# Patient Record
Sex: Male | Born: 1955 | Race: White | Hispanic: No | Marital: Married | Smoking: Current every day smoker
Health system: Southern US, Community
[De-identification: ages and names within clinical notes are randomized; demographics above are authoritative.]

## PROBLEM LIST (undated history)

## (undated) DIAGNOSIS — I499 Cardiac arrhythmia, unspecified: Secondary | ICD-10-CM

## (undated) DIAGNOSIS — I2510 Atherosclerotic heart disease of native coronary artery without angina pectoris: Secondary | ICD-10-CM

## (undated) DIAGNOSIS — I10 Essential (primary) hypertension: Secondary | ICD-10-CM

## (undated) DIAGNOSIS — I219 Acute myocardial infarction, unspecified: Secondary | ICD-10-CM

## (undated) DIAGNOSIS — J449 Chronic obstructive pulmonary disease, unspecified: Secondary | ICD-10-CM

## (undated) HISTORY — PX: OTHER SURGICAL HISTORY: SHX169

---

## 2003-03-10 ENCOUNTER — Encounter
Admission: RE | Admit: 2003-03-10 | Discharge: 2003-06-08 | Payer: Self-pay | Admitting: Physical Medicine & Rehabilitation

## 2003-04-29 ENCOUNTER — Encounter: Payer: Self-pay | Admitting: Physical Medicine & Rehabilitation

## 2003-04-29 ENCOUNTER — Ambulatory Visit (HOSPITAL_COMMUNITY)
Admission: RE | Admit: 2003-04-29 | Discharge: 2003-04-29 | Payer: Self-pay | Admitting: Physical Medicine & Rehabilitation

## 2012-02-22 DIAGNOSIS — I240 Acute coronary thrombosis not resulting in myocardial infarction: Secondary | ICD-10-CM | POA: Insufficient documentation

## 2012-02-26 HISTORY — PX: CORONARY ANGIOPLASTY: SHX604

## 2012-02-26 HISTORY — PX: CARDIAC CATHETERIZATION: SHX172

## 2013-03-24 DIAGNOSIS — K769 Liver disease, unspecified: Secondary | ICD-10-CM | POA: Insufficient documentation

## 2013-03-24 DIAGNOSIS — I059 Rheumatic mitral valve disease, unspecified: Secondary | ICD-10-CM | POA: Insufficient documentation

## 2013-03-24 DIAGNOSIS — M47816 Spondylosis without myelopathy or radiculopathy, lumbar region: Secondary | ICD-10-CM | POA: Insufficient documentation

## 2013-03-24 DIAGNOSIS — M542 Cervicalgia: Secondary | ICD-10-CM | POA: Insufficient documentation

## 2013-07-08 DIAGNOSIS — K635 Polyp of colon: Secondary | ICD-10-CM | POA: Insufficient documentation

## 2013-08-28 DIAGNOSIS — M545 Low back pain, unspecified: Secondary | ICD-10-CM | POA: Insufficient documentation

## 2013-10-01 DIAGNOSIS — I456 Pre-excitation syndrome: Secondary | ICD-10-CM | POA: Insufficient documentation

## 2013-10-12 ENCOUNTER — Other Ambulatory Visit (HOSPITAL_COMMUNITY): Payer: Self-pay | Admitting: Neurosurgery

## 2013-10-12 DIAGNOSIS — S335XXA Sprain of ligaments of lumbar spine, initial encounter: Secondary | ICD-10-CM

## 2013-10-13 ENCOUNTER — Other Ambulatory Visit: Payer: Self-pay | Admitting: Radiology

## 2013-10-14 ENCOUNTER — Encounter (HOSPITAL_COMMUNITY): Payer: Self-pay | Admitting: Pharmacy Technician

## 2013-10-16 ENCOUNTER — Ambulatory Visit (HOSPITAL_COMMUNITY)
Admission: RE | Admit: 2013-10-16 | Discharge: 2013-10-16 | Disposition: A | Discharge status: Home / Self Care | Payer: Worker's Compensation | Source: Ambulatory Visit | Attending: Neurosurgery | Admitting: Neurosurgery

## 2013-10-16 DIAGNOSIS — S335XXA Sprain of ligaments of lumbar spine, initial encounter: Secondary | ICD-10-CM

## 2013-10-16 DIAGNOSIS — M545 Low back pain, unspecified: Secondary | ICD-10-CM | POA: Insufficient documentation

## 2013-10-16 MED ORDER — OXYCODONE-ACETAMINOPHEN 5-325 MG PO TABS
ORAL_TABLET | ORAL | Status: AC
  Filled 2013-10-16: qty 2

## 2013-10-16 MED ORDER — ONDANSETRON HCL 4 MG/2ML IJ SOLN
INTRAMUSCULAR | Status: AC
  Filled 2013-10-16: qty 2

## 2013-10-16 MED ORDER — ONDANSETRON HCL 4 MG/2ML IJ SOLN
4.0000 mg | Freq: Once | INTRAMUSCULAR | Status: DC
  Administered 2013-10-16: 4 mg via INTRAMUSCULAR

## 2013-10-16 MED ORDER — MEPERIDINE HCL 50 MG/ML IJ SOLN
INTRAMUSCULAR | Status: AC
  Filled 2013-10-16: qty 1

## 2013-10-16 MED ORDER — DIAZEPAM 5 MG PO TABS: 10.0000 mg | ORAL_TABLET | Freq: Once | ORAL | Status: DC

## 2013-10-16 MED ORDER — MEPERIDINE HCL 25 MG/ML IJ SOLN
25.0000 mg | Freq: Once | INTRAMUSCULAR | Status: DC
  Administered 2013-10-16: 25 mg via INTRAMUSCULAR

## 2013-10-16 MED ORDER — IOHEXOL 180 MG/ML  SOLN
20.0000 mL | Freq: Once | INTRAMUSCULAR | Status: AC | PRN
  Administered 2013-10-16: 20 mL via INTRATHECAL

## 2013-10-16 MED ORDER — OXYCODONE-ACETAMINOPHEN 5-325 MG PO TABS
2.0000 | ORAL_TABLET | Freq: Once | ORAL | Status: DC
  Administered 2013-10-16: 2 via ORAL

## 2013-10-16 NOTE — Progress Notes (Signed)
Per  Order will neither sign consent nor premedicate patient in short stay center.  To be done in radiology per order

## 2014-02-05 DIAGNOSIS — R609 Edema, unspecified: Secondary | ICD-10-CM | POA: Insufficient documentation

## 2014-02-05 DIAGNOSIS — E785 Hyperlipidemia, unspecified: Secondary | ICD-10-CM | POA: Insufficient documentation

## 2014-02-17 ENCOUNTER — Other Ambulatory Visit: Payer: Self-pay | Admitting: Neurosurgery

## 2014-03-02 ENCOUNTER — Encounter (HOSPITAL_COMMUNITY)
Admission: RE | Admit: 2014-03-02 | Discharge: 2014-03-02 | Disposition: A | Discharge status: Home / Self Care | Payer: Worker's Compensation | Source: Ambulatory Visit | Attending: Neurosurgery | Admitting: Neurosurgery

## 2014-03-02 ENCOUNTER — Encounter (HOSPITAL_COMMUNITY): Payer: Self-pay

## 2014-03-02 DIAGNOSIS — I2510 Atherosclerotic heart disease of native coronary artery without angina pectoris: Secondary | ICD-10-CM | POA: Insufficient documentation

## 2014-03-02 DIAGNOSIS — Z01818 Encounter for other preprocedural examination: Secondary | ICD-10-CM | POA: Insufficient documentation

## 2014-03-02 DIAGNOSIS — I10 Essential (primary) hypertension: Secondary | ICD-10-CM | POA: Insufficient documentation

## 2014-03-02 DIAGNOSIS — Z01812 Encounter for preprocedural laboratory examination: Secondary | ICD-10-CM | POA: Insufficient documentation

## 2014-03-02 HISTORY — DX: Atherosclerotic heart disease of native coronary artery without angina pectoris: I25.10

## 2014-03-02 HISTORY — DX: Chronic obstructive pulmonary disease, unspecified: J44.9

## 2014-03-02 HISTORY — DX: Acute myocardial infarction, unspecified: I21.9

## 2014-03-02 HISTORY — DX: Cardiac arrhythmia, unspecified: I49.9

## 2014-03-02 HISTORY — DX: Essential (primary) hypertension: I10

## 2014-03-02 LAB — CBC WITH DIFFERENTIAL/PLATELET
BASOS PCT: 0 % (ref 0–1)
Basophils Absolute: 0 10*3/uL (ref 0.0–0.1)
EOS ABS: 0.2 10*3/uL (ref 0.0–0.7)
Eosinophils Relative: 3 % (ref 0–5)
HCT: 42.8 % (ref 39.0–52.0)
HEMOGLOBIN: 15.2 g/dL (ref 13.0–17.0)
Lymphocytes Relative: 29 % (ref 12–46)
Lymphs Abs: 2 10*3/uL (ref 0.7–4.0)
MCH: 32.7 pg (ref 26.0–34.0)
MCHC: 35.5 g/dL (ref 30.0–36.0)
MCV: 92 fL (ref 78.0–100.0)
Monocytes Absolute: 0.4 10*3/uL (ref 0.1–1.0)
Monocytes Relative: 6 % (ref 3–12)
NEUTROS PCT: 62 % (ref 43–77)
Neutro Abs: 4.3 10*3/uL (ref 1.7–7.7)
Platelets: 215 10*3/uL (ref 150–400)
RBC: 4.65 MIL/uL (ref 4.22–5.81)
RDW: 12.5 % (ref 11.5–15.5)
WBC: 6.9 10*3/uL (ref 4.0–10.5)

## 2014-03-02 LAB — COMPREHENSIVE METABOLIC PANEL
ALK PHOS: 62 U/L (ref 39–117)
ALT: 17 U/L (ref 0–53)
AST: 18 U/L (ref 0–37)
Albumin: 3.6 g/dL (ref 3.5–5.2)
BILIRUBIN TOTAL: 0.5 mg/dL (ref 0.3–1.2)
BUN: 13 mg/dL (ref 6–23)
CHLORIDE: 103 meq/L (ref 96–112)
CO2: 25 meq/L (ref 19–32)
Calcium: 9 mg/dL (ref 8.4–10.5)
Creatinine, Ser: 0.9 mg/dL (ref 0.50–1.35)
GLUCOSE: 99 mg/dL (ref 70–99)
POTASSIUM: 4.1 meq/L (ref 3.7–5.3)
Sodium: 140 mEq/L (ref 137–147)
Total Protein: 7.1 g/dL (ref 6.0–8.3)

## 2014-03-02 LAB — ABO/RH: ABO/RH(D): O POS

## 2014-03-02 LAB — SURGICAL PCR SCREEN
MRSA, PCR: NEGATIVE
Staphylococcus aureus: NEGATIVE

## 2014-03-02 LAB — TYPE AND SCREEN
ABO/RH(D): O POS
Antibody Screen: NEGATIVE

## 2014-03-02 NOTE — Progress Notes (Signed)
Patient primarily goes to [facility].   Sees Dr. Suezanne CheshireScott Deen @ [facility] in [place].  [phone] and have requested LOV which was last month, any heart tests, ekg, etc.    He did have MI in 2013, proceeded from there to have stent placed by a Dr[facility] Dory Per[place]ung [place] [facility]. Having No issues now, except for his back.  Denies CP or SOB.   Have sent a request for records to both [facility] and Dr. Payton Sparkeen's office.   DA

## 2014-03-02 NOTE — Pre-Procedure Instructions (Signed)
Rodman Keynthony E Guevarra  03/02/2014   Your procedure is scheduled on: April 7th, Tuesday.   [facility]  2 * 3 at 5:30  AM.   Call this number if you have problems the morning of surgery: [phone]   Remember:   Do not eat food or drink liquids after midnight Monday.   Take these medicines the morning of surgery with A SIP OF WATER: Valium, Metoprolol, Oxycodone, Inhalers.   Do not wear jewelry - no rings or watches.  Do not wear lotions or colognes.  You may NOT  wear deodorant.   Men may shave face and neck.   Do not bring valuables to the hospital.  [facility] is not responsible for any belongings or valuables.               Contacts, dentures or bridgework may not be worn into surgery.  Leave suitcase in the car. After surgery it may be brought to your room.  For patients admitted to the hospital, discharge time is determined by your treatment team.               Name and phone number of your driver:    Special Instructions:  "Preparing for Surgery" instruction sheet.   Please read over the following fact sheets that you were given: Pain Booklet, Coughing and Deep Breathing, Blood Transfusion Information, MRSA Information and Surgical Site Infection Prevention

## 2014-03-02 NOTE — Progress Notes (Signed)
03/02/14 1257  OBSTRUCTIVE SLEEP APNEA  Have you ever been diagnosed with sleep apnea through a sleep study? No  Do you snore loudly (loud enough to be heard through closed doors)?  0  Do you often feel tired, fatigued, or sleepy during the daytime? 0 (d/t current back issue)  Has anyone observed you stop breathing during your sleep? 0  Do you have, or are you being treated for high blood pressure? 1  BMI more than 35 kg/m2? 0  Age over 59 years old? 1  Neck circumference greater than 40 cm/18 inches? 1  Gender: 1  Obstructive Sleep Apnea Score 4  Score 4 or greater  Results sent to PCP    03/02/14 1257  OBSTRUCTIVE SLEEP APNEA  Have you ever been diagnosed with sleep apnea through a sleep study? No  Do you snore loudly (loud enough to be heard through closed doors)?  0  Do you often feel tired, fatigued, or sleepy during the daytime? 0 (d/t current back issue)  Has anyone observed you stop breathing during your sleep? 0  Do you have, or are you being treated for high blood pressure? 1  BMI more than 35 kg/m2? 0  Age over 59 years old? 1  Neck circumference greater than 40 cm/18 inches? 1  Gender: 1  Obstructive Sleep Apnea Score 4  Score 4 or greater  Results sent to PCP

## 2014-03-05 ENCOUNTER — Encounter (HOSPITAL_COMMUNITY): Payer: Self-pay | Admitting: Vascular Surgery

## 2014-03-05 ENCOUNTER — Encounter (HOSPITAL_COMMUNITY): Payer: Self-pay

## 2014-03-05 NOTE — Progress Notes (Addendum)
Anesthesia chart review: Patient is a 58 year old male scheduled for left L3-4 XLIF on 03/09/14 by Dr. Jordan LikesPool.  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 BMI 34 consistent with obesity. PCP is listed as Dr. Barron Alvineavid Gibson. Cardiologist is Dr. Joneen Roachody Deen.  He saw patient on 10/01/13 for preoperative evaluation for back surgery.  He was felt to be intermediate risk, but based on most recent ACC/AHA guideline he could proceed to surgery without further evaluation.   EKG on 03/02/14 showed NSR, cannot rule out anterior infarct (age undetermined).  Echo on 06/03/13 [facility] showed: LVH, LVEF 60-65%, diastolic left ventricular dysfunction, degenerative mitral valve disease, normal right ventricular contractile performance, small pericardial effusion.    Cardiac cath on 02/22/12 showed: Left main, LCx, RCA (small, non-dominant) with no angiographic evidence of significant disease. 95% proximal LAD s/p Xienceh drug-eluting stent to proximal LAD with 0% residual stenosis. Mildly elevated left-sided filling pressures. LVEDP = 17 mmHg.   Preoperative CXR and labs noted.  Patient was cleared for surgery by his cardiologist is late 09/2013.  Further evaluation by his assigned anesthesiologist on the day of surgery. If no acute changes then I would anticipate that he could proceed as planned.  Velna Ochsllison Alessandra Sawdey, PA-C [facility]Anesthesiology Phone [phone] 03/05/2014 12:59 PM

## 2014-03-08 MED ORDER — DEXAMETHASONE SODIUM PHOSPHATE 10 MG/ML IJ SOLN: 10.0000 mg | INTRAMUSCULAR | Status: DC

## 2014-03-08 MED ORDER — CEFAZOLIN SODIUM-DEXTROSE 2-3 GM-% IV SOLR: 2.0000 g | INTRAVENOUS | Status: DC

## 2014-03-09 ENCOUNTER — Encounter (HOSPITAL_COMMUNITY): Admission: RE | Payer: Self-pay | Source: Ambulatory Visit

## 2014-03-09 ENCOUNTER — Inpatient Hospital Stay (HOSPITAL_COMMUNITY): Admission: RE | Admit: 2014-03-09 | Payer: Worker's Compensation | Source: Ambulatory Visit | Admitting: Neurosurgery

## 2014-03-09 SURGERY — ANTERIOR LATERAL LUMBAR FUSION 1 LEVEL
Anesthesia: General | Laterality: Left

## 2014-09-15 DIAGNOSIS — I2510 Atherosclerotic heart disease of native coronary artery without angina pectoris: Secondary | ICD-10-CM | POA: Insufficient documentation

## 2015-06-28 DIAGNOSIS — I5033 Acute on chronic diastolic (congestive) heart failure: Secondary | ICD-10-CM | POA: Insufficient documentation

## 2015-06-28 DIAGNOSIS — I10 Essential (primary) hypertension: Secondary | ICD-10-CM | POA: Insufficient documentation

## 2015-08-30 DIAGNOSIS — J410 Simple chronic bronchitis: Secondary | ICD-10-CM | POA: Insufficient documentation

## 2015-09-01 DIAGNOSIS — R7989 Other specified abnormal findings of blood chemistry: Secondary | ICD-10-CM | POA: Insufficient documentation

## 2016-02-21 DIAGNOSIS — J449 Chronic obstructive pulmonary disease, unspecified: Secondary | ICD-10-CM | POA: Insufficient documentation

## 2016-02-29 DIAGNOSIS — IMO0002 Reserved for concepts with insufficient information to code with codable children: Secondary | ICD-10-CM | POA: Insufficient documentation

## 2016-05-03 ENCOUNTER — Other Ambulatory Visit: Payer: Self-pay | Admitting: Orthopaedic Surgery

## 2016-05-03 DIAGNOSIS — M545 Low back pain, unspecified: Secondary | ICD-10-CM

## 2016-05-03 DIAGNOSIS — G8929 Other chronic pain: Secondary | ICD-10-CM

## 2016-05-14 ENCOUNTER — Ambulatory Visit
Admission: RE | Admit: 2016-05-14 | Discharge: 2016-05-14 | Disposition: A | Discharge status: Home / Self Care | Payer: Worker's Compensation | Source: Ambulatory Visit | Attending: Orthopaedic Surgery | Admitting: Orthopaedic Surgery

## 2016-05-14 ENCOUNTER — Encounter: Payer: Self-pay | Admitting: Radiology

## 2016-05-14 DIAGNOSIS — G8929 Other chronic pain: Secondary | ICD-10-CM

## 2016-05-14 DIAGNOSIS — M545 Low back pain, unspecified: Secondary | ICD-10-CM

## 2016-05-14 MED ORDER — IOPAMIDOL (ISOVUE-M 300) INJECTION 61%
10.0000 mL | Freq: Once | INTRAMUSCULAR | Status: AC | PRN
  Administered 2016-05-14: 10 mL via INTRATHECAL

## 2016-05-14 MED ORDER — DIAZEPAM 5 MG PO TABS: 10.0000 mg | ORAL_TABLET | Freq: Once | ORAL | Status: DC

## 2016-05-14 NOTE — Discharge Instructions (Signed)

## 2017-08-02 ENCOUNTER — Other Ambulatory Visit: Payer: Self-pay | Admitting: Orthopaedic Surgery

## 2017-08-02 DIAGNOSIS — M542 Cervicalgia: Secondary | ICD-10-CM

## 2017-08-02 DIAGNOSIS — M5416 Radiculopathy, lumbar region: Secondary | ICD-10-CM

## 2017-08-02 DIAGNOSIS — M546 Pain in thoracic spine: Secondary | ICD-10-CM

## 2017-08-19 ENCOUNTER — Other Ambulatory Visit: Payer: Self-pay

## 2017-08-19 ENCOUNTER — Inpatient Hospital Stay
Admission: RE | Admit: 2017-08-19 | Discharge: 2017-08-19 | Disposition: A | Discharge status: Home / Self Care | Payer: Self-pay | Source: Ambulatory Visit | Attending: Orthopaedic Surgery | Admitting: Orthopaedic Surgery

## 2017-08-19 NOTE — Discharge Instructions (Signed)

## 2017-08-30 ENCOUNTER — Ambulatory Visit
Admission: RE | Admit: 2017-08-30 | Discharge: 2017-08-30 | Disposition: A | Discharge status: Home / Self Care | Payer: Medicare HMO | Source: Ambulatory Visit | Attending: Orthopaedic Surgery | Admitting: Orthopaedic Surgery

## 2017-08-30 DIAGNOSIS — M546 Pain in thoracic spine: Secondary | ICD-10-CM

## 2017-08-30 DIAGNOSIS — M542 Cervicalgia: Secondary | ICD-10-CM

## 2017-08-30 DIAGNOSIS — M5416 Radiculopathy, lumbar region: Secondary | ICD-10-CM

## 2017-08-30 MED ORDER — IOPAMIDOL (ISOVUE-M 300) INJECTION 61%
10.0000 mL | Freq: Once | INTRAMUSCULAR | Status: AC | PRN
  Administered 2017-08-30: 10 mL via INTRATHECAL

## 2017-08-30 MED ORDER — DIAZEPAM 5 MG PO TABS
10.0000 mg | ORAL_TABLET | Freq: Once | ORAL | Status: AC
  Administered 2017-08-30: 10 mg via ORAL

## 2017-08-30 MED ORDER — MEPERIDINE HCL 100 MG/ML IJ SOLN
100.0000 mg | Freq: Once | INTRAMUSCULAR | Status: AC
  Administered 2017-08-30: 100 mg via INTRAMUSCULAR

## 2017-08-30 MED ORDER — HYDROXYZINE HCL 50 MG/ML IM SOLN
25.0000 mg | Freq: Four times a day (QID) | INTRAMUSCULAR | Status: DC | PRN
  Administered 2017-08-30: 25 mg via INTRAMUSCULAR

## 2017-08-30 NOTE — Discharge Instructions (Signed)

## 2018-12-16 ENCOUNTER — Other Ambulatory Visit: Payer: Self-pay | Admitting: Orthopaedic Surgery

## 2018-12-16 DIAGNOSIS — M542 Cervicalgia: Secondary | ICD-10-CM

## 2018-12-16 DIAGNOSIS — M546 Pain in thoracic spine: Secondary | ICD-10-CM

## 2018-12-17 ENCOUNTER — Other Ambulatory Visit: Payer: Self-pay | Admitting: Otolaryngology

## 2019-01-02 ENCOUNTER — Other Ambulatory Visit: Payer: Self-pay | Admitting: Orthopaedic Surgery

## 2019-01-02 DIAGNOSIS — M546 Pain in thoracic spine: Secondary | ICD-10-CM

## 2019-01-02 DIAGNOSIS — M542 Cervicalgia: Secondary | ICD-10-CM

## 2019-01-19 ENCOUNTER — Ambulatory Visit
Admission: RE | Admit: 2019-01-19 | Discharge: 2019-01-19 | Disposition: A | Discharge status: Home / Self Care | Payer: Medicare Other | Source: Ambulatory Visit | Attending: Orthopaedic Surgery | Admitting: Orthopaedic Surgery

## 2019-01-19 DIAGNOSIS — M542 Cervicalgia: Secondary | ICD-10-CM

## 2019-01-19 DIAGNOSIS — M546 Pain in thoracic spine: Secondary | ICD-10-CM

## 2019-09-15 IMAGING — CT CT T SPINE W/O CM
3 of 4 series · 9 of 33 positions shown, 10 images · non-contrast
Comparison: Thoracic myelogram CT 08/30/2017.

CLINICAL DATA: Neck and back pain with numbness and tingling in
both arms. Two previous surgeries, most recently 10 months ago.

EXAM:
CT THORACIC SPINE WITHOUT CONTRAST
TECHNIQUE: Multidetector CT images of the thoracic were obtained using the
standard protocol without intravenous contrast.

[Series 3: t-spine 2.00 br40 s3 ax · axial · 0.36mm/px · z∈[-1031,-1031]mm · 1 of 188 slices shown, 2 images]
[im 94/188  soft-tissue]
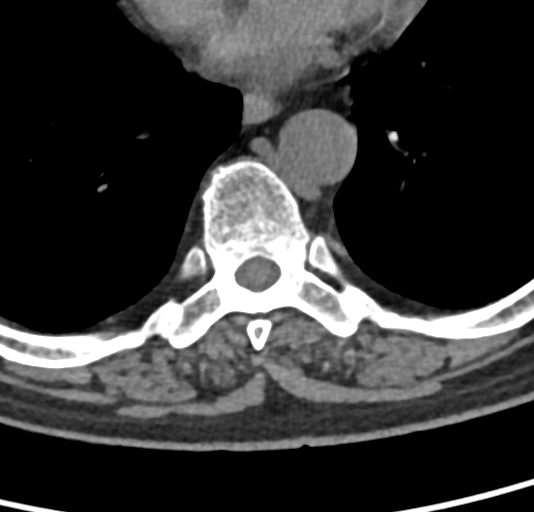
[im 94/188  bone]
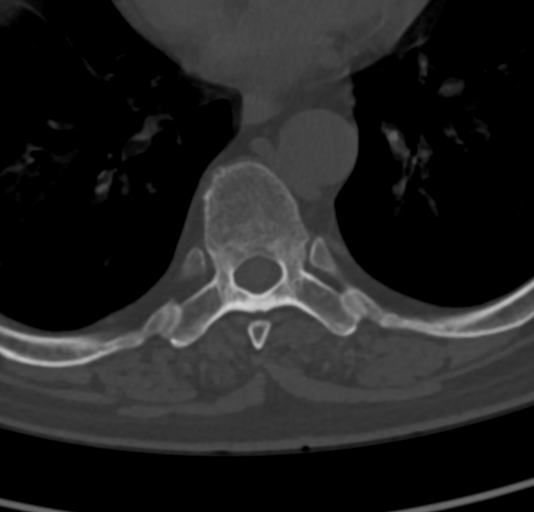

[Series 7: t-spine 2.00 br60 s3 sag · sagittal · 0.35mm/px · 5 of 77 slices shown]
[im 26/77  bone]
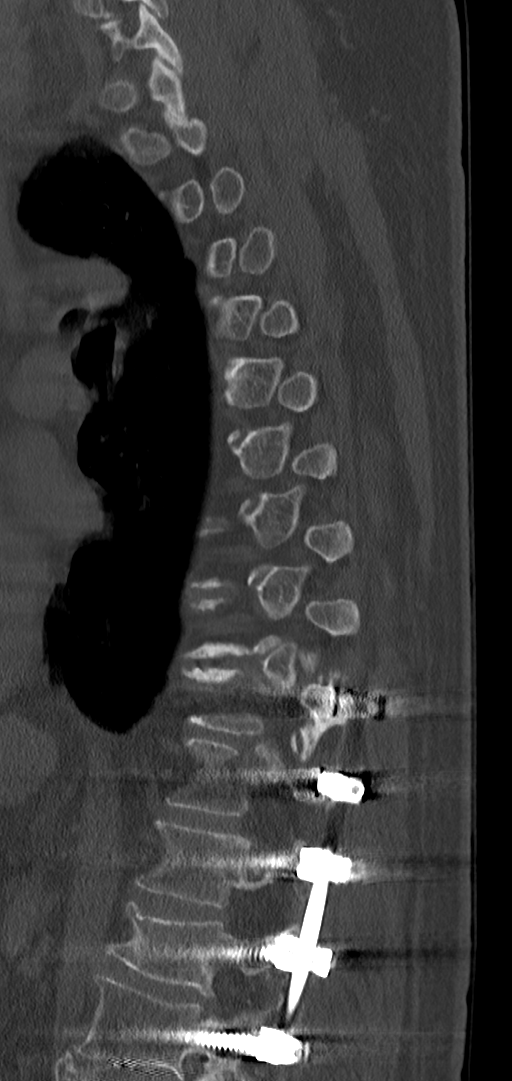
[im 32/77  bone]
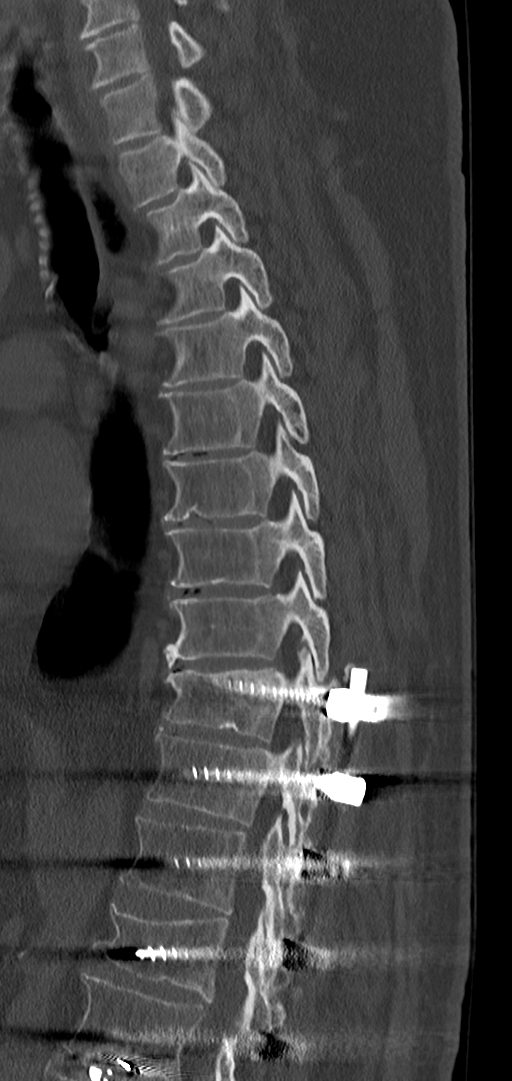
[im 39/77  bone]
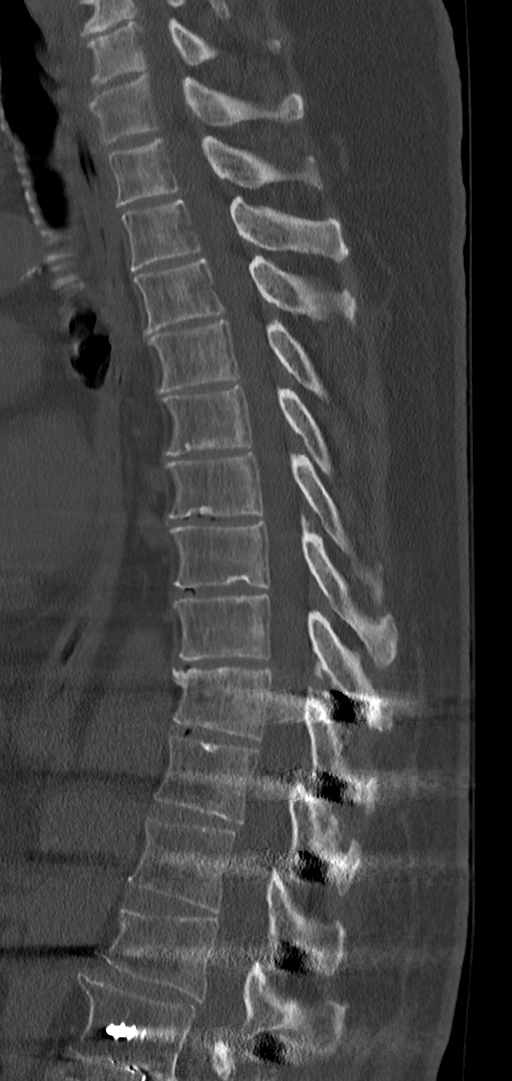
[im 45/77  bone]
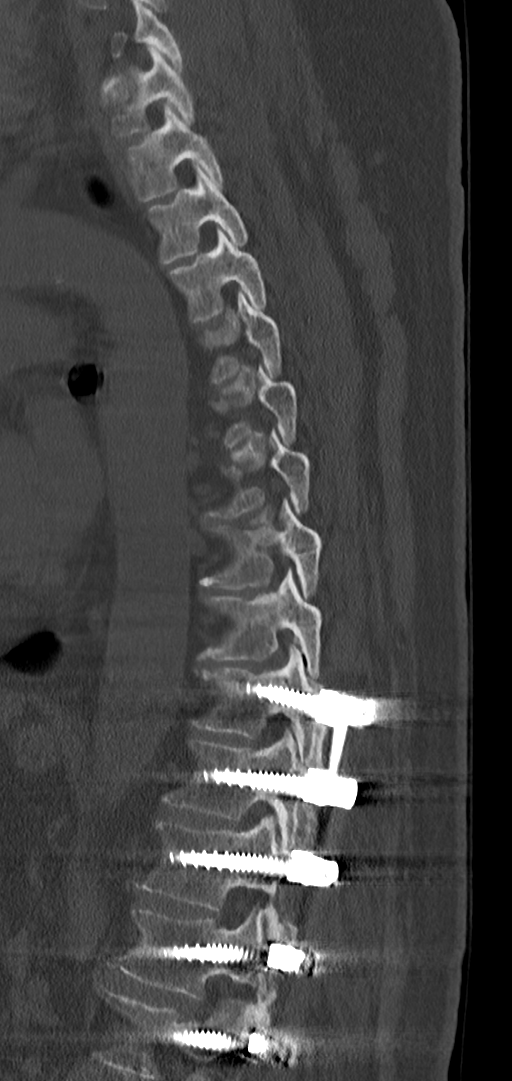
[im 51/77  bone]
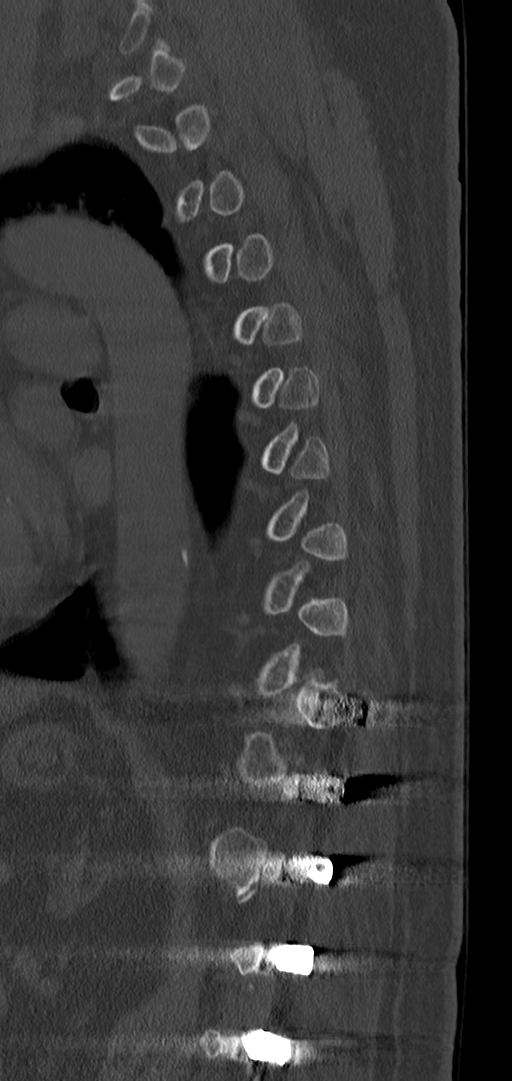

[Series 9: t-spine 2.00 br60 s3 cor · coronal · 0.30mm/px · 3 of 83 slices shown]
[im 17/83  bone]
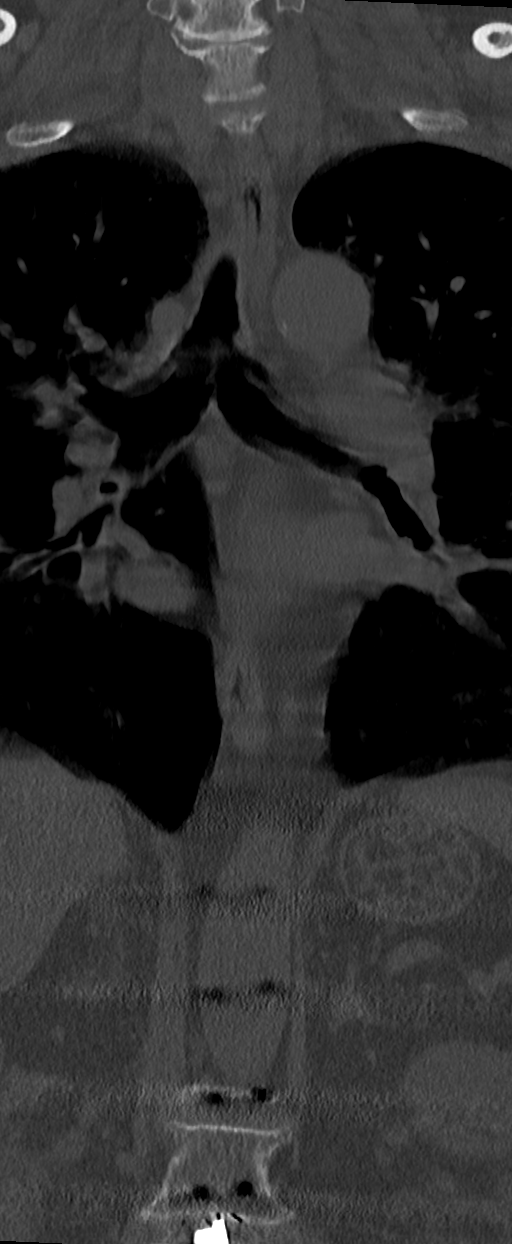
[im 33/83  bone]
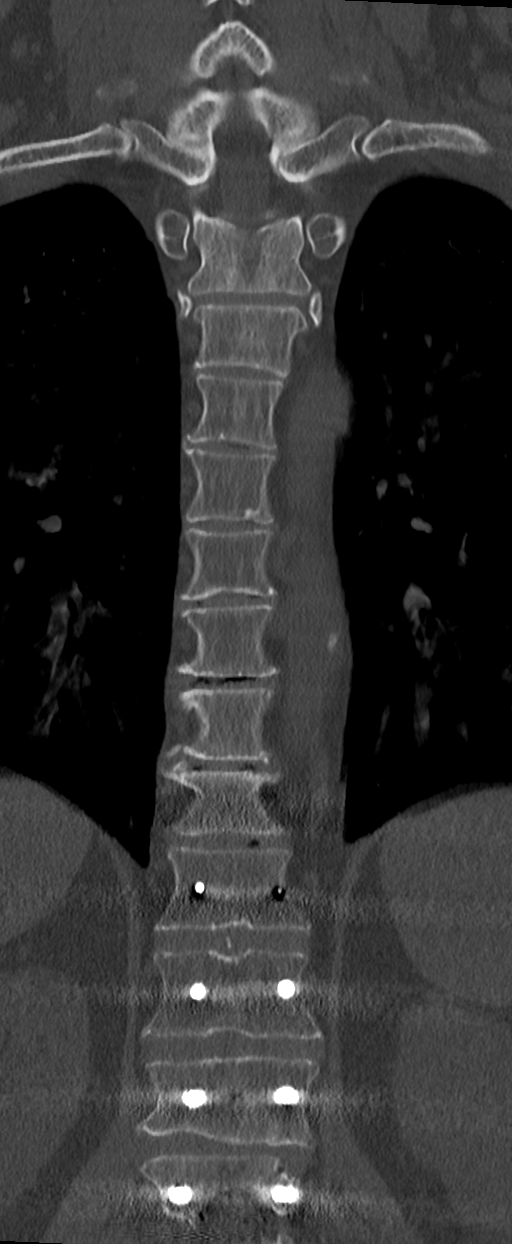
[im 50/83  bone]
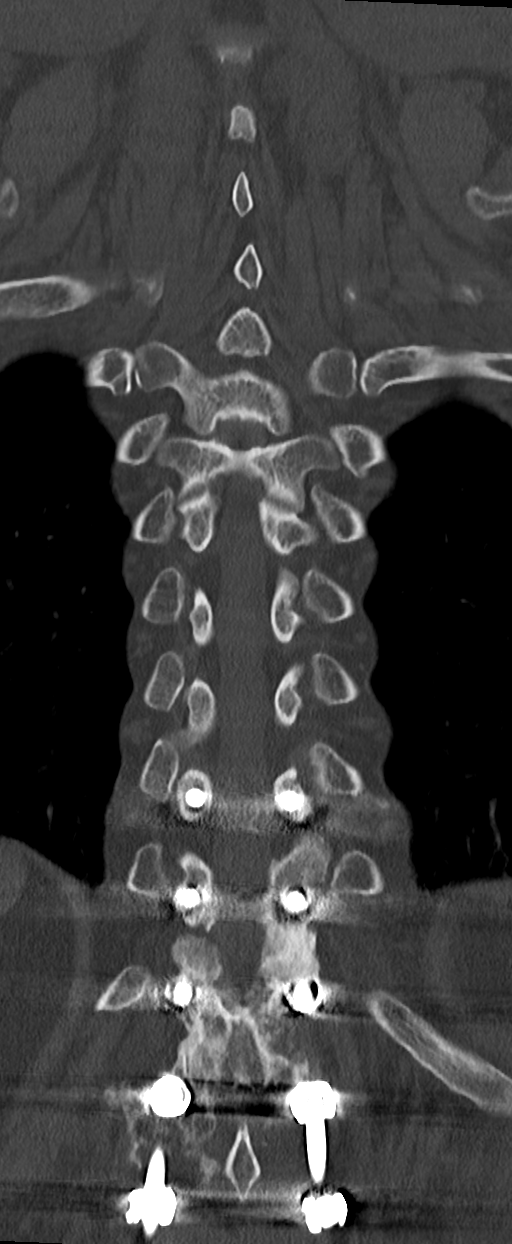

[9 of 33 positions shown; findings below may reference images not displayed]

FINDINGS: Alignment: Normal.

Vertebrae: Status post thoracolumbar fusion, the inferior extent of
which is not imaged by this examination. Bilateral pedicle screws
are present from T10 through L2. Questionable minimal loosening of
the right T10 pedicle screw. There are stable mild chronic wedge
deformities at T10 and L1. No acute osseous findings.

Paraspinal and other soft tissues: Unremarkable.

Disc levels: No large disc herniation or significant spinal stenosis
identified. Detail is mildly limited inferiorly by artifact from the
spinal hardware.
IMPRESSION: 1. No acute findings or explanation for the patient's symptoms.
2. Previous thoracolumbar fusion. The right T10 pedicle screw
demonstrates questionable mild loosening, but no displacement.
3. Stable mild chronic wedge deformities at T10 and L1.
